# Patient Record
Sex: Male | Born: 1995 | Race: White | Hispanic: No | Marital: Single | Smoking: Never smoker
Health system: Southern US, Community
[De-identification: ages and names within clinical notes are randomized; demographics above are authoritative.]

---

## 2019-06-15 ENCOUNTER — Emergency Department: Payer: Managed Care, Other (non HMO)

## 2019-06-15 ENCOUNTER — Encounter: Payer: Self-pay | Admitting: Emergency Medicine

## 2019-06-15 ENCOUNTER — Other Ambulatory Visit: Payer: Self-pay

## 2019-06-15 ENCOUNTER — Emergency Department
Admission: EM | Admit: 2019-06-15 | Discharge: 2019-06-15 | Disposition: A | Discharge status: Home / Self Care | Payer: Managed Care, Other (non HMO) | Attending: Emergency Medicine | Admitting: Emergency Medicine

## 2019-06-15 DIAGNOSIS — Y999 Unspecified external cause status: Secondary | ICD-10-CM | POA: Insufficient documentation

## 2019-06-15 DIAGNOSIS — Y929 Unspecified place or not applicable: Secondary | ICD-10-CM | POA: Diagnosis not present

## 2019-06-15 DIAGNOSIS — Y9389 Activity, other specified: Secondary | ICD-10-CM | POA: Insufficient documentation

## 2019-06-15 DIAGNOSIS — S60222A Contusion of left hand, initial encounter: Secondary | ICD-10-CM | POA: Diagnosis not present

## 2019-06-15 DIAGNOSIS — S6992XA Unspecified injury of left wrist, hand and finger(s), initial encounter: Secondary | ICD-10-CM | POA: Diagnosis present

## 2019-06-15 MED ORDER — MUPIROCIN CALCIUM 2 % EX CREA: 1.0000 "application " | TOPICAL_CREAM | Freq: Two times a day (BID) | CUTANEOUS | 0 refills | Status: AC

## 2019-06-15 MED ORDER — MELOXICAM 15 MG PO TABS: 15.0000 mg | ORAL_TABLET | Freq: Every day | ORAL | 1 refills | Status: AC
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
## 2019-06-23 ENCOUNTER — Other Ambulatory Visit: Payer: Self-pay

## 2019-06-23 DIAGNOSIS — Z20822 Contact with and (suspected) exposure to covid-19: Secondary | ICD-10-CM

## 2019-06-25 LAB — NOVEL CORONAVIRUS, NAA: SARS-CoV-2, NAA: NOT DETECTED
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
